# Patient Record
Sex: Male | Born: 1980 | Race: White | Hispanic: No | Marital: Married | Smoking: Current every day smoker
Health system: Southern US, Community
[De-identification: ages and names within clinical notes are randomized; demographics above are authoritative.]

---

## 2002-08-19 ENCOUNTER — Emergency Department (HOSPITAL_COMMUNITY): Admission: EM | Admit: 2002-08-19 | Discharge: 2002-08-19 | Payer: Self-pay | Admitting: Emergency Medicine

## 2003-06-15 ENCOUNTER — Emergency Department (HOSPITAL_COMMUNITY): Admission: EM | Admit: 2003-06-15 | Discharge: 2003-06-15 | Payer: Self-pay | Admitting: Emergency Medicine

## 2010-11-03 ENCOUNTER — Emergency Department: Payer: Self-pay | Admitting: Unknown Physician Specialty

## 2011-01-05 ENCOUNTER — Emergency Department: Payer: Self-pay | Admitting: *Deleted

## 2012-06-05 ENCOUNTER — Emergency Department: Payer: Self-pay | Admitting: Emergency Medicine

## 2013-05-22 ENCOUNTER — Emergency Department: Payer: Self-pay | Admitting: Internal Medicine

## 2014-10-25 ENCOUNTER — Encounter (HOSPITAL_COMMUNITY): Payer: Self-pay | Admitting: Emergency Medicine

## 2014-10-25 ENCOUNTER — Emergency Department (HOSPITAL_COMMUNITY): Payer: Self-pay

## 2014-10-25 ENCOUNTER — Emergency Department (HOSPITAL_COMMUNITY)
Admission: EM | Admit: 2014-10-25 | Discharge: 2014-10-25 | Disposition: A | Discharge status: Home / Self Care | Payer: Self-pay | Attending: Emergency Medicine | Admitting: Emergency Medicine

## 2014-10-25 DIAGNOSIS — X58XXXA Exposure to other specified factors, initial encounter: Secondary | ICD-10-CM | POA: Insufficient documentation

## 2014-10-25 DIAGNOSIS — Y9289 Other specified places as the place of occurrence of the external cause: Secondary | ICD-10-CM | POA: Insufficient documentation

## 2014-10-25 DIAGNOSIS — R109 Unspecified abdominal pain: Secondary | ICD-10-CM

## 2014-10-25 DIAGNOSIS — R1013 Epigastric pain: Secondary | ICD-10-CM | POA: Insufficient documentation

## 2014-10-25 DIAGNOSIS — R079 Chest pain, unspecified: Secondary | ICD-10-CM | POA: Insufficient documentation

## 2014-10-25 DIAGNOSIS — T189XXA Foreign body of alimentary tract, part unspecified, initial encounter: Secondary | ICD-10-CM | POA: Insufficient documentation

## 2014-10-25 DIAGNOSIS — Z720 Tobacco use: Secondary | ICD-10-CM | POA: Insufficient documentation

## 2014-10-25 DIAGNOSIS — Y998 Other external cause status: Secondary | ICD-10-CM | POA: Insufficient documentation

## 2014-10-25 DIAGNOSIS — Y9389 Activity, other specified: Secondary | ICD-10-CM | POA: Insufficient documentation

## 2014-10-25 LAB — BASIC METABOLIC PANEL
Anion gap: 11 (ref 5–15)
BUN: 7 mg/dL (ref 6–20)
CHLORIDE: 102 mmol/L (ref 101–111)
CO2: 23 mmol/L (ref 22–32)
CREATININE: 1.03 mg/dL (ref 0.61–1.24)
Calcium: 9.3 mg/dL (ref 8.9–10.3)
GFR calc Af Amer: >60 mL/min (ref >60)
GFR calc non Af Amer: >60 mL/min (ref >60)
Glucose, Bld: 122 mg/dL — ABNORMAL HIGH (ref 65–99)
POTASSIUM: 3.5 mmol/L (ref 3.5–5.1)
Sodium: 136 mmol/L (ref 135–145)

## 2014-10-25 LAB — CBC
HCT: 45.3 % (ref 39.0–52.0)
Hemoglobin: 15.9 g/dL (ref 13.0–17.0)
MCH: 32.4 pg (ref 26.0–34.0)
MCHC: 35.1 g/dL (ref 30.0–36.0)
MCV: 92.4 fL (ref 78.0–100.0)
PLATELETS: 318 10*3/uL (ref 150–400)
RBC: 4.9 MIL/uL (ref 4.22–5.81)
RDW: 12.4 % (ref 11.5–15.5)
WBC: 15.5 10*3/uL — ABNORMAL HIGH (ref 4.0–10.5)

## 2014-10-25 LAB — I-STAT TROPONIN, ED
TROPONIN I, POC: 0.03 ng/mL (ref 0.00–0.08)
Troponin i, poc: 0 ng/mL (ref 0.00–0.08)

## 2014-10-25 MED ORDER — GI COCKTAIL ~~LOC~~
30.0000 mL | Freq: Once | ORAL | Status: AC
  Administered 2014-10-25: 30 mL via ORAL
  Filled 2014-10-25: qty 30

## 2014-10-25 MED ORDER — SUCRALFATE 1 G PO TABS: 1.0000 g | ORAL_TABLET | Freq: Three times a day (TID) | ORAL | Status: AC

## 2014-10-25 MED ORDER — ESOMEPRAZOLE MAGNESIUM 40 MG PO CPDR: 40.0000 mg | DELAYED_RELEASE_CAPSULE | Freq: Every day | ORAL | Status: DC

## 2014-10-25 MED ORDER — SUCRALFATE 1 G PO TABS
1.0000 g | ORAL_TABLET | Freq: Once | ORAL | Status: AC
  Administered 2014-10-25: 1 g via ORAL
  Filled 2014-10-25: qty 1

## 2014-10-25 NOTE — Discharge Instructions (Signed)
Your caregiver has diagnosed you as having chest pain that is not specific for one problem, but does not require admission.  You are at low risk for an acute heart condition or other serious illness. Chest pain comes from many different causes.  SEEK IMMEDIATE MEDICAL ATTENTION IF: You have severe chest pain, especially if the pain is crushing or pressure-like and spreads to the arms, back, neck, or jaw, or if you have sweating, nausea (feeling sick to your stomach), or shortness of breath. THIS IS AN EMERGENCY. Don't wait to see if the pain will go away. Get medical help at once. Call 911 or 0 (operator). DO NOT drive yourself to the hospital.  Your chest pain gets worse and does not go away with rest.  You have an attack of chest pain lasting longer than usual, despite rest and treatment with the medications your caregiver has prescribed.  You wake from sleep with chest pain or shortness of breath.  You feel dizzy or faint.  You have chest pain not typical of your usual pain for which you originally saw your caregiver. Abdominal (belly) pain can be caused by many things. Your caregiver performed an examination and possibly ordered blood/urine tests and imaging (CT scan, x-rays, ultrasound). Many cases can be observed and treated at home after initial evaluation in the emergency department. Even though you are being discharged home, abdominal pain can be unpredictable. Therefore, you need a repeated exam if your pain does not resolve, returns, or worsens. Most patients with abdominal pain don't have to be admitted to the hospital or have surgery, but serious problems like appendicitis and gallbladder attacks can start out as nonspecific pain. Many abdominal conditions cannot be diagnosed in one visit, so follow-up evaluations are very important. SEEK IMMEDIATE MEDICAL ATTENTION IF: The pain does not go away or becomes severe.  A temperature above 101 develops.  Repeated vomiting occurs (multiple  episodes).  The pain becomes localized to portions of the abdomen. The right side could possibly be appendicitis. In an adult, the left lower portion of the abdomen could be colitis or diverticulitis.  Blood is being passed in stools or vomit (bright red or black tarry stools).  Return also if you develop chest pain, difficulty breathing, dizziness or fainting, or become confused, poorly responsive, or inconsolable (young children).   Swallowed Foreign Body, Adult You have swallowed an object (foreign body). Once the foreign body has passed through the food tube (esophagus), which leads from the mouth to the stomach, it will usually continue through the body without problems. This is because the point where the esophagus enters into the stomach is the narrowest place through which the foreign body must pass. Sometimes the foreign body gets stuck. The most common type of foreign body obstruction in adults is food impaction. Many times, bones from fish or meat products may become lodged in the esophagus or injure the throat on the way down. When there is an object that obstructs the esophagus, the most obvious symptoms are pain and the inability to swallow normally. In some cases, foreign bodies that can be life threatening are swallowed. Examples of these are certain medications and illicit drugs. Often in these instances, patients are afraid of telling what they swallowed. However, it is extremely important to tell the emergency caregiver what was swallowed because life-saving treatment may be needed.  X-ray exams may be taken to find the location of the foreign body. However, some objects do not show up well or may  be too small to be seen on an X-ray image. If the foreign body is too large or too sharp, it may be too dangerous to allow it to pass on its own. You may need to see a caregiver who specializes in the digestive system (gastroenterologist). In a few cases, a specialist may need to remove the  object using a method called "endoscopy". This involves passing a thin, soft, flexible tube into the food pipe to locate and remove the object. Follow up with your primary doctor or the referral you were given by the emergency caregiver. HOME CARE INSTRUCTIONS   If your caregiver says it is safe for you to eat, then only have liquids and soft foods until your symptoms improve.  Once you are eating normally:  Cut food into small pieces.  Remove small bones from food.  Remove large seeds and pits from fruit.  Chew your food well.  Do not talk, laugh, or engage in physical activity while eating or swallowing. SEEK MEDICAL CARE IF:  You develop worsening shortness of breath, uncontrollable coughing, chest pains or high fever, greater than 102 F (38.9 C).  You are unable to eat or drink or you feel that food is getting stuck in your throat.  You have choking symptoms or cannot stop drooling.  You develop abdominal pain, vomiting (especially of blood), or rectal bleeding. MAKE SURE YOU:   Understand these instructions.  Will watch your condition.  Will get help right away if you are not doing well or get worse. Document Released: 10/20/2009 Document Revised: 07/25/2011 Document Reviewed: 10/20/2009 [facility] Patient Information 2015 [place], [place]. This information is not intended to replace advice given to you by your health care provider. Make sure you discuss any questions you have with your health care provider.

## 2014-10-25 NOTE — ED Notes (Signed)
Patient transported to X-ray 

## 2014-10-25 NOTE — ED Notes (Signed)
Pt c/o swallowed stainless steel on Wednesday. C/o left sided chest pain onset today.

## 2014-10-25 NOTE — ED Provider Notes (Addendum)
CSN: [id number]     Arrival date & time 10/25/14  1005 History   First MD Initiated Contact with Patient 10/25/14 1035     Chief Complaint  Patient presents with  . Chest Pain  . Swallowed Foreign Body     (Consider location/radiation/quality/duration/timing/severity/associated sxs/prior Treatment) HPI  This 34 year old male who presents emergency Department with chief complaint of swallowing foreign body and epigastric pain. Patient states that Wednesday, 3 days ago that he was working with a piece of stainless steel metal wire which was abraded. He cut the wire, one piece bounced off his lip, but the other piece went into his mouth and he swallowed it. He states that he didn't notice any side effects until yesterday when he began having epigastric pain and nausea. He states it has worsened since that time. He rates it as moderate, no vomiting, no hematemesis, no diarrhea, melena or hematochezia.  History reviewed. No pertinent past medical history. History reviewed. No pertinent past surgical history. No family history on file. History  Substance Use Topics  . Smoking status: Current Every Day Smoker  . Smokeless tobacco: Not on file  . Alcohol Use: Yes    Review of Systems  Ten systems reviewed and are negative for acute change, except as noted in the HPI.     Allergies  Review of patient's allergies indicates no known allergies.  Home Medications   Prior to Admission medications   Not on File   BP 129/76 mmHg  Pulse 67  Temp(Src) 97.9 F (36.6 C) (Oral)  Resp 16  Ht  (1.803 m)  Wt 200 lb (90.719 kg)  BMI 27.91 kg/m2  SpO2 97% Physical Exam Physical Exam  Nursing note and vitals reviewed. Constitutional: He appears well-developed and well-nourished. No distress.  HENT:  Head: Normocephalic and atraumatic.  Eyes: Conjunctivae normal are normal. No scleral icterus.  Neck: Normal range of motion. Neck supple.  Cardiovascular: Normal rate, regular rhythm  and normal heart sounds.   Pulmonary/Chest: Effort normal and breath sounds normal. No respiratory distress.  Abdominal: Soft. There is no tenderness.  Musculoskeletal: He exhibits no edema.  Neurological: He is alert.  Skin: Skin is warm and dry. He is not diaphoretic.  Psychiatric: His behavior is normal.    ED Course  Procedures (including critical care time) Labs Review Labs Reviewed  CBC - Abnormal; Notable for the following:    WBC 15.5 (*)    All other components within normal limits  BASIC METABOLIC PANEL - Abnormal; Notable for the following:    Glucose, Bld 122 (*)    All other components within normal limits  I-STAT TROPOININ, ED    Imaging Review No results found.   EKG Interpretation   Date/Time:  Saturday October 25 2014 10:11:47 EDT Ventricular Rate:  86 PR Interval:  120 QRS Duration: 90 QT Interval:  366 QTC Calculation: 437 R Axis:   65 Text Interpretation:  Normal sinus rhythm with sinus arrhythmia Normal ECG  No previous tracing Confirmed by Denton Lank  MD, Caryn Bee ([id number]) on 10/25/2014  10:36:42 AM      MDM   Final diagnoses:  Swallowed foreign body, initial encounter    Filed Vitals:   10/25/14 1415 10/25/14 1510 10/25/14 1515 10/25/14 1600  BP: 122/75 119/69 122/79 131/74  Pulse: 58 59 56 67  Temp:    98.1 F (36.7 C)  TempSrc:    Oral  Resp: Height:      Weight:  SpO2: 98% 98% 97% 98%    Patient with epigastric abdominal pain, swallowed foreign body. Pain controlled with Carafate and GI cocktail. He has no evidence of perforation on acute abdominal films. 2 negative troponins and minimal risk factors for acute coronary syndrome. Patient seen in shared visit with Dr. Denton Lank  .    patient is advised to follow-up with community health and wellness  Discharged with Carafate and Nexium. He appears safe for discharge at this time.  Arthor Captain, PA-C 10/25/14 1621  Cathren Laine, MD 10/26/14 0709  Arthor Captain,  PA-C 12/12/14 1232  Cathren Laine, MD 12/22/14 [phone]

## 2015-09-17 ENCOUNTER — Emergency Department
Admission: EM | Admit: 2015-09-17 | Discharge: 2015-09-17 | Disposition: A | Discharge status: Home / Self Care | Payer: Self-pay | Attending: Emergency Medicine | Admitting: Emergency Medicine

## 2015-09-17 ENCOUNTER — Encounter: Payer: Self-pay | Admitting: Emergency Medicine

## 2015-09-17 DIAGNOSIS — F1290 Cannabis use, unspecified, uncomplicated: Secondary | ICD-10-CM | POA: Insufficient documentation

## 2015-09-17 DIAGNOSIS — Z7982 Long term (current) use of aspirin: Secondary | ICD-10-CM | POA: Insufficient documentation

## 2015-09-17 DIAGNOSIS — F17200 Nicotine dependence, unspecified, uncomplicated: Secondary | ICD-10-CM | POA: Insufficient documentation

## 2015-09-17 DIAGNOSIS — Z79899 Other long term (current) drug therapy: Secondary | ICD-10-CM | POA: Insufficient documentation

## 2015-09-17 DIAGNOSIS — L089 Local infection of the skin and subcutaneous tissue, unspecified: Secondary | ICD-10-CM | POA: Insufficient documentation

## 2015-09-17 DIAGNOSIS — B9689 Other specified bacterial agents as the cause of diseases classified elsewhere: Secondary | ICD-10-CM

## 2015-09-17 MED ORDER — HYDROXYZINE HCL 50 MG PO TABS: 50.0000 mg | ORAL_TABLET | Freq: Three times a day (TID) | ORAL | Status: AC | PRN

## 2015-09-17 MED ORDER — MUPIROCIN 2 % EX OINT: TOPICAL_OINTMENT | CUTANEOUS | Status: AC

## 2015-09-17 NOTE — ED Provider Notes (Signed)
[facility] Emergency Department Provider Note   ____________________________________________  Time seen: Approximately 9:46 AM  I have reviewed the triage vital signs and the nursing notes.   HISTORY  Chief Complaint Facial Swelling    HPI Nathan Lara is a 35 y.o. male patient complain pain, edema and rednessinferior left orbital area 3-4 days. Patient denies any drainage from the area. 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 Patient say noticed an abrasion which has increased redness and swelling. Patient does use Epsom salt soaks with some mild relief. He denies any vision change.   History reviewed. No pertinent past medical history.  There are no active problems to display for this patient.   History reviewed. No pertinent past surgical history.  Current Outpatient Rx  Name  Route  Sig  Dispense  Refill  . aspirin-acetaminophen-caffeine (EXCEDRIN MIGRAINE) 250-250-65 MG per tablet   Oral   Take 2 tablets by mouth every 6 (six) hours as needed for headache.         . esomeprazole (NEXIUM) 40 MG capsule   Oral   Take 1 capsule (40 mg total) by mouth daily.   30 capsule   0   . hydrOXYzine (ATARAX/VISTARIL) 50 MG tablet   Oral   Take 1 tablet (50 mg total) by mouth 3 (three) times daily as needed.   30 tablet   0   . mupirocin ointment (BACTROBAN) 2 %      Apply to affected area 3 times daily   22 g   0   . OVER THE COUNTER MEDICATION      1 application daily as needed (Chest/nasal congestion). Mint/lavendar/lemon essential oil combination         . sucralfate (CARAFATE) 1 G tablet   Oral   Take 1 tablet (1 g total) by mouth 4 (four) times daily -  with meals and at bedtime.   60 tablet   0     Allergies Review of patient's allergies indicates no known allergies.  No family history on file.  Social History Social History    Substance Use Topics  . Smoking status: Current Every Day Smoker  . Smokeless tobacco: None  . Alcohol Use: Yes    Review of Systems Constitutional: No fever/chills Eyes: No visual changes. ENT: No sore throat. Cardiovascular: Denies chest pain. Respiratory: Denies shortness of breath. Gastrointestinal: No abdominal pain.  No nausea, no vomiting.  No diarrhea.  No constipation. Genitourinary: Negative for dysuria. Musculoskeletal: Negative for back pain. Skin: Negative for rash.Abrasion left inferior orbital area Neurological: Negative for headaches, focal weakness or numbness.    ____________________________________________   PHYSICAL EXAM:  VITAL SIGNS: ED Triage Vitals  Enc Vitals Group     BP 09/17/15 0931 144/87 mmHg     Pulse Rate 09/17/15 0931 80     Resp 09/17/15 0931 16     Temp 09/17/15 0931 97.5 F (36.4 C)     Temp Source 09/17/15 0931 Oral     SpO2 09/17/15 0931 99 %     Weight 09/17/15 0931 200 lb (90.719 kg)     Height 09/17/15 0931 6' (1.829 m)     Head Cir --      Peak Flow --      Pain Score 09/17/15 0931 1     Pain Loc --      Pain Edu? --  Excl. in GC? --     Constitutional: Alert and oriented. Well appearing and in no acute distress. Eyes: Conjunctivae are normal. PERRL. EOMI. Head: Atraumatic. Nose: No congestion/rhinnorhea. Mouth/Throat: Mucous membranes are moist.  Oropharynx non-erythematous. Neck: No stridor.  No cervical spine tenderness to palpation. Hematological/Lymphatic/Immunilogical: No cervical lymphadenopathy. Cardiovascular: Normal rate, regular rhythm. Grossly normal heart sounds.  Good peripheral circulation. Respiratory: Normal respiratory effort.  No retractions. Lungs CTAB. Gastrointestinal: Soft and nontender. No distention. No abdominal bruits. No CVA tenderness. Musculoskeletal: No lower extremity tenderness nor edema.  No joint effusions. Neurologic:  Normal speech and language. No gross focal neurologic  deficits are appreciated. No gait instability. Skin:  Skin is warm, dry and intact. No rash noted. Psychiatric: Mood and affect are normal. Speech and behavior are normal.  ____________________________________________   LABS (all labs ordered are listed, but only abnormal results are displayed)  Labs Reviewed - No data to display ____________________________________________  EKG   ____________________________________________  RADIOLOGY   ____________________________________________   PROCEDURES  Procedure(s) performed: None  Critical Care performed: No  ____________________________________________   INITIAL IMPRESSION / ASSESSMENT AND PLAN / ED COURSE  Pertinent labs & imaging results that were available during my care of the patient were reviewed by me and considered in my medical decision making (see chart for details).  Infected abrasion inferior left orbital area. Patient given discharge Instructions. Patient given a prescriptions for Bactroban and Atarax. Patient [facility] no improvement in 3-5 days. Return by ER if condition worsens. ____________________________________________   FINAL CLINICAL IMPRESSION(S) / ED DIAGNOSES  Final diagnoses:  Bacterial skin infection      NEW MEDICATIONS STARTED DURING THIS VISIT:  New Prescriptions   HYDROXYZINE (ATARAX/VISTARIL) 50 MG TABLET    Take 1 tablet (50 mg total) by mouth 3 (three) times daily as needed.   MUPIROCIN OINTMENT (BACTROBAN) 2 %    Apply to affected area 3 times daily     Note:  This document was prepared using Dragon voice recognition software and may include unintentional dictation errors.    Joni Reiningonald K Smith, PA-C 09/17/15 [id number]  Jene Everyobert Kinner, MD 09/17/15 1247

## 2015-09-17 NOTE — Discharge Instructions (Signed)
Take medication as directed.

## 2015-09-17 NOTE — ED Notes (Signed)
See triage note  Left eye red and swollen  No drainage note and denies any trauma

## 2015-09-17 NOTE — ED Notes (Signed)
Pt reports abrasion under left eye due to wiping sweat off face; reports hx of the same. Pt reports redness and swelling increased in the past couple of days, used epsom salt, warm water which helped decrease swelling some. Pt reports swelling worse than normal.

## 2015-11-17 IMAGING — CR DG ABDOMEN ACUTE W/ 1V CHEST
4 series · 4 of 4 positions shown · non-contrast
Comparison: 05/22/2013 chest radiograph

CLINICAL DATA: Swallowed a piece of stainless steel wire yesterday
at work. Left upper abdominal pain.

EXAM:
DG ABDOMEN ACUTE W/ 1V CHEST

[chest pa]
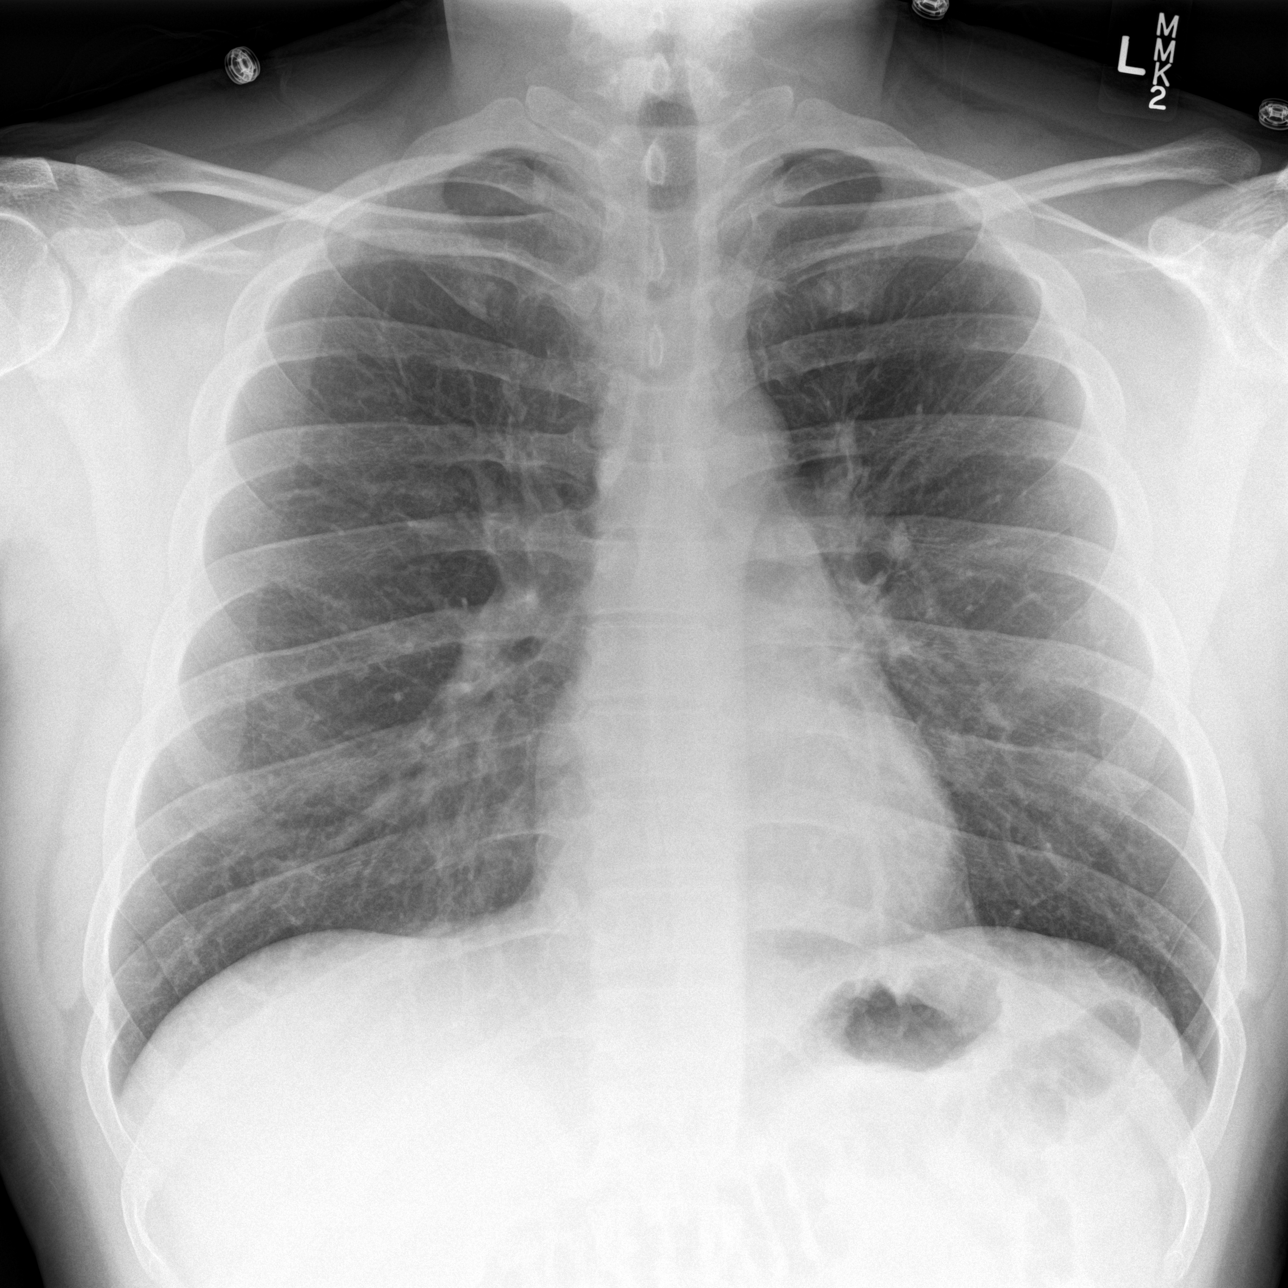

[abdomen erect]
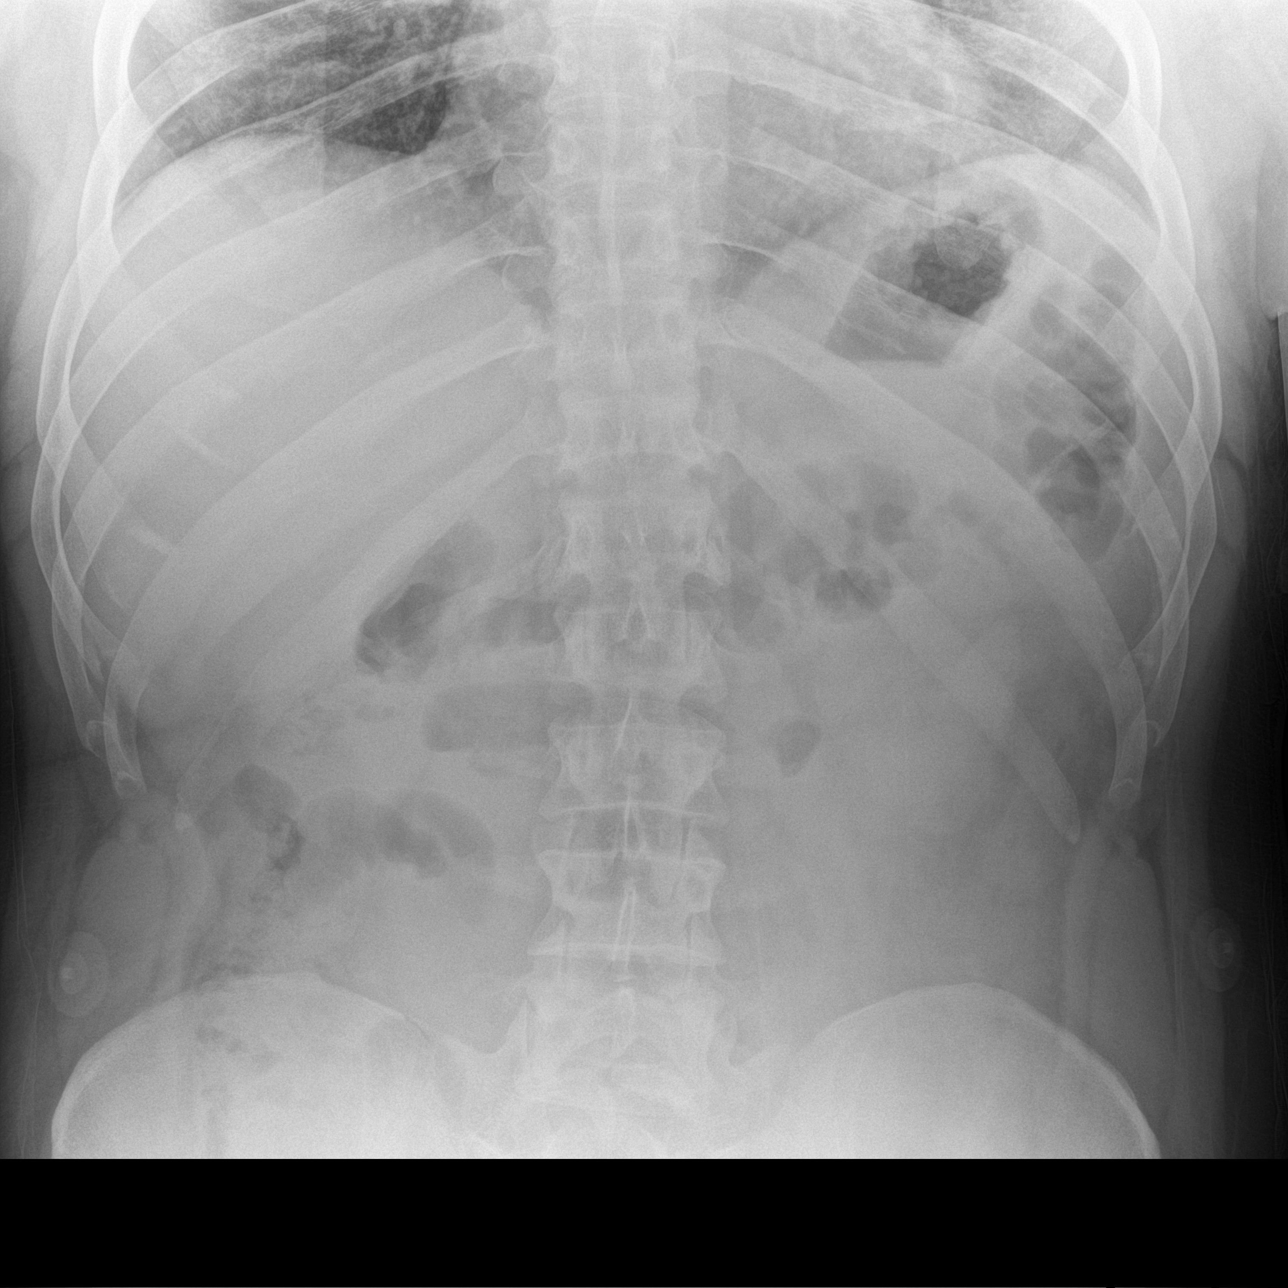

[abdomen supine (1 of 2)]
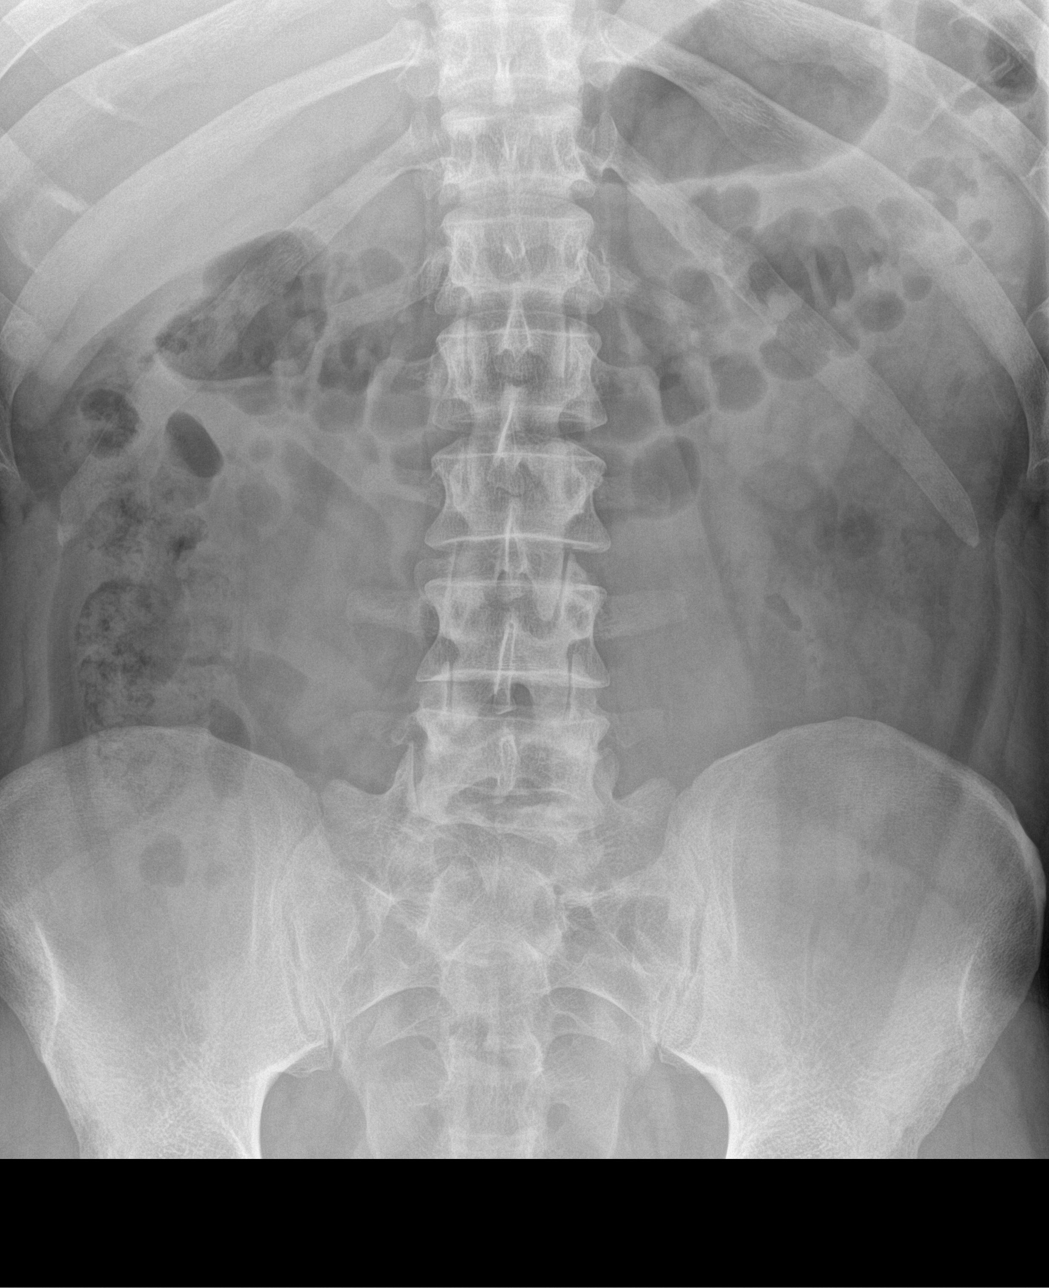

[abdomen supine (2 of 2)]
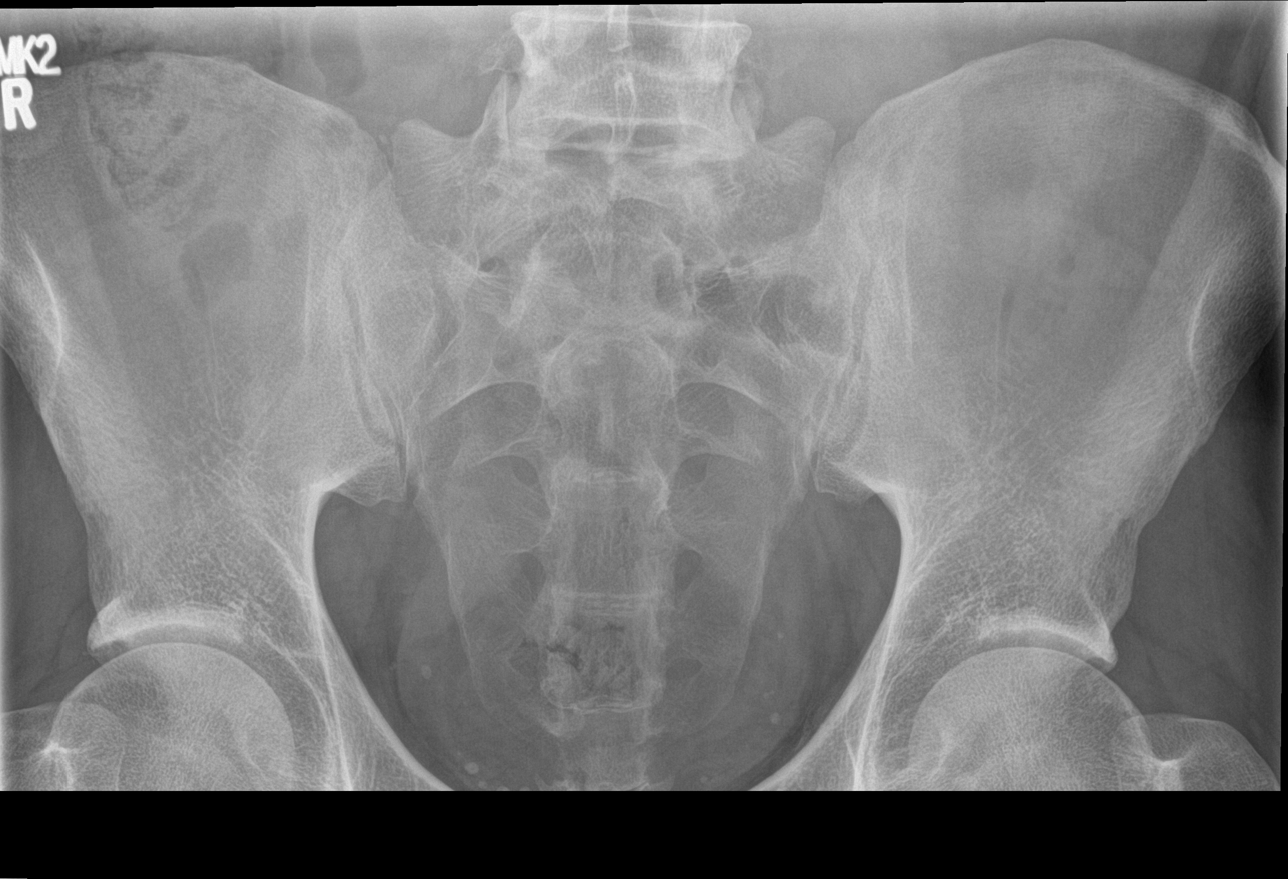

[4 of 4 positions shown; findings below may reference images not displayed]

FINDINGS: Frontal view of the chest demonstrates midline trachea. Normal heart
size and mediastinal contours. No pleural effusion or pneumothorax.
Diffuse peribronchial thickening. No lobar consolidation.

Abdominal films demonstrate no free intraperitoneal air or
significant air-fluid levels on upright positioning. No small bowel
distension. No metallic foreign object identified. Probable
phleboliths in the pelvis. Distal gas and stool.
IMPRESSION: No acute findings.

Peribronchial thickening which may relate to chronic bronchitis or
smoking.

## 2019-08-22 ENCOUNTER — Ambulatory Visit: Payer: Self-pay | Attending: Internal Medicine

## 2019-08-22 DIAGNOSIS — Z23 Encounter for immunization: Secondary | ICD-10-CM

## 2019-08-22 NOTE — Progress Notes (Signed)
   Covid-19 Vaccination Clinic  Name:  Nathan Lara    MRN: [id number] DOB: 12/03/1980  08/22/2019  [name] was observed post Covid-19 immunization for 15 minutes without incident. He was provided with Vaccine Information Sheet and instruction to access the V-Safe system.   [name] was instructed to call 911 with any severe reactions post vaccine: Marland Kitchen Difficulty breathing  . Swelling of face and throat  . A fast heartbeat  . A bad rash all over body  . Dizziness and weakness   Immunizations Administered    Name Date Dose VIS Date Route   Pfizer COVID-19 Vaccine 08/22/2019  9:02 AM 0.3 mL 04/26/2019 Intramuscular   Manufacturer: [employer], [employer]   Lot: [id number]   NDC: [address]-8

## 2019-09-16 ENCOUNTER — Ambulatory Visit: Payer: Self-pay | Attending: Internal Medicine

## 2019-09-16 DIAGNOSIS — Z23 Encounter for immunization: Secondary | ICD-10-CM

## 2019-09-16 NOTE — Progress Notes (Signed)
   Covid-19 Vaccination Clinic  Name:  Nathan Lara    MRN: [id number] DOB: 08/22/1980  09/16/2019  Nathan Lara was observed post Covid-19 immunization for 15 minutes without incident. He was provided with Vaccine Information Sheet and instruction to access the V-Safe system.   Nathan Lara was instructed to call 911 with any severe reactions post vaccine: Marland Kitchen Difficulty breathing  . Swelling of face and throat  . A fast heartbeat  . A bad rash all over body  . Dizziness and weakness   Immunizations Administered    Name Date Dose VIS Date Route   Pfizer COVID-19 Vaccine 09/16/2019  8:31 AM 0.3 mL 07/10/2018 Intramuscular   Manufacturer: [employer], [employer]   Lot: [id number]   NDC: [address]-3

## 2023-04-11 ENCOUNTER — Emergency Department
Admission: EM | Admit: 2023-04-11 | Discharge: 2023-04-11 | Disposition: A | Discharge status: Home / Self Care | Payer: Self-pay | Attending: Emergency Medicine | Admitting: Emergency Medicine

## 2023-04-11 ENCOUNTER — Other Ambulatory Visit: Payer: Self-pay

## 2023-04-11 ENCOUNTER — Emergency Department: Payer: Self-pay

## 2023-04-11 DIAGNOSIS — D72829 Elevated white blood cell count, unspecified: Secondary | ICD-10-CM | POA: Diagnosis not present

## 2023-04-11 DIAGNOSIS — K2900 Acute gastritis without bleeding: Secondary | ICD-10-CM | POA: Insufficient documentation

## 2023-04-11 DIAGNOSIS — R1013 Epigastric pain: Secondary | ICD-10-CM | POA: Diagnosis present

## 2023-04-11 LAB — URINALYSIS, ROUTINE W REFLEX MICROSCOPIC
Bilirubin Urine: NEGATIVE
Glucose, UA: NEGATIVE mg/dL
Hgb urine dipstick: NEGATIVE
Ketones, ur: NEGATIVE mg/dL
Leukocytes,Ua: NEGATIVE
Nitrite: NEGATIVE
Protein, ur: NEGATIVE mg/dL
Specific Gravity, Urine: 1.003 — ABNORMAL LOW (ref 1.005–1.030)
pH: 7 (ref 5.0–8.0)

## 2023-04-11 LAB — CBC
HCT: 45.5 % (ref 39.0–52.0)
Hemoglobin: 16.1 g/dL (ref 13.0–17.0)
MCH: 33.5 pg (ref 26.0–34.0)
MCHC: 35.4 g/dL (ref 30.0–36.0)
MCV: 94.6 fL (ref 80.0–100.0)
Platelets: 326 10*3/uL (ref 150–400)
RBC: 4.81 MIL/uL (ref 4.22–5.81)
RDW: 12.1 % (ref 11.5–15.5)
WBC: 18.8 10*3/uL — ABNORMAL HIGH (ref 4.0–10.5)
nRBC: 0 % (ref 0.0–0.2)

## 2023-04-11 LAB — COMPREHENSIVE METABOLIC PANEL
ALT: 33 U/L (ref 0–44)
AST: 27 U/L (ref 15–41)
Albumin: 4.2 g/dL (ref 3.5–5.0)
Alkaline Phosphatase: 60 U/L (ref 38–126)
Anion gap: 9 (ref 5–15)
BUN: 10 mg/dL (ref 6–20)
CO2: 24 mmol/L (ref 22–32)
Calcium: 9.4 mg/dL (ref 8.9–10.3)
Chloride: 101 mmol/L (ref 98–111)
Creatinine, Ser: 1.02 mg/dL (ref 0.61–1.24)
GFR, Estimated: >60 mL/min (ref >60)
Glucose, Bld: 127 mg/dL — ABNORMAL HIGH (ref 70–99)
Potassium: 4.1 mmol/L (ref 3.5–5.1)
Sodium: 134 mmol/L — ABNORMAL LOW (ref 135–145)
Total Bilirubin: 1.1 mg/dL (ref <1.2)
Total Protein: 7.5 g/dL (ref 6.5–8.1)

## 2023-04-11 LAB — LIPASE, BLOOD: Lipase: 25 U/L (ref 11–51)

## 2023-04-11 MED ORDER — ONDANSETRON 4 MG PO TBDP: 4.0000 mg | ORAL_TABLET | Freq: Three times a day (TID) | ORAL | 0 refills | Status: AC | PRN

## 2023-04-11 MED ORDER — LIDOCAINE VISCOUS HCL 2 % MT SOLN
15.0000 mL | Freq: Once | OROMUCOSAL | Status: AC
  Administered 2023-04-11: 15 mL via ORAL
  Filled 2023-04-11: qty 15

## 2023-04-11 MED ORDER — PANTOPRAZOLE SODIUM 40 MG PO TBEC: 40.0000 mg | DELAYED_RELEASE_TABLET | Freq: Two times a day (BID) | ORAL | 0 refills | Status: AC

## 2023-04-11 MED ORDER — ALUM & MAG HYDROXIDE-SIMETH 200-200-20 MG/5ML PO SUSP
30.0000 mL | Freq: Once | ORAL | Status: AC
  Administered 2023-04-11: 30 mL via ORAL
  Filled 2023-04-11: qty 30

## 2023-04-11 NOTE — ED Provider Notes (Signed)
[facility] Provider Note    Event Date/Time   First MD Initiated Contact with Patient 04/11/23 [phone]     (approximate)   History   Chief Complaint Abdominal Pain   HPI  Nathan Lara is a 42 y.o. male with no significant past medical history who presents to the ED complaining of abdominal pain.  Patient reports that he has had 2 days of constant sharp pain in his epigastrium associated with some nausea.  He has had similar episodes of pain in the past that typically seem to improve when he belches.  He denies any associated pain in his chest and has not had any fever, cough, or difficulty breathing.  He denies any vomiting or diarrhea, has not noticed any blood in his stool.  He also denies any dysuria, hematuria, or flank pain.  He does admit to drinking greater than half a pot of coffee daily and regular use of NSAIDs, denies regular alcohol consumption.     Physical Exam   Triage Vital Signs: ED Triage Vitals  Encounter Vitals Group     BP 04/11/23 0745 (!) 148/91     Systolic BP Percentile --      Diastolic BP Percentile --      Pulse Rate 04/11/23 0745 78     Resp 04/11/23 0745 18     Temp 04/11/23 0745 98 F (36.7 C)     Temp src --      SpO2 04/11/23 0745 99 %     Weight 04/11/23 0739 200 lb (90.7 kg)     Height 04/11/23 0739 6' (1.829 m)     Head Circumference --      Peak Flow --      Pain Score 04/11/23 0739 6     Pain Loc --      Pain Education --      Exclude from Growth Chart --     Most recent vital signs: Vitals:   04/11/23 0745 04/11/23 1045  BP: (!) 148/91 (!) 151/93  Pulse: 78 86  Resp: 18 16  Temp: 98 F (36.7 C) 98.4 F (36.9 C)  SpO2: 99% 100%    Constitutional: Alert and oriented. Eyes: Conjunctivae are normal. Head: Atraumatic. Nose: No congestion/rhinnorhea. Mouth/Throat: Mucous membranes are moist.  Cardiovascular: Normal rate, regular rhythm. Grossly normal heart sounds.  2+ radial pulses  bilaterally. Respiratory: Normal respiratory effort.  No retractions. Lungs CTAB. Gastrointestinal: Soft and tender to palpation in the epigastrium with no rebound or guarding.  No distention. Musculoskeletal: No lower extremity tenderness nor edema.  Neurologic:  Normal speech and language. No gross focal neurologic deficits are appreciated.    ED Results / Procedures / Treatments   Labs (all labs ordered are listed, but only abnormal results are displayed) Labs Reviewed  COMPREHENSIVE METABOLIC PANEL - Abnormal; Notable for the following components:      Result Value   Sodium 134 (*)    Glucose, Bld 127 (*)    All other components within normal limits  CBC - Abnormal; Notable for the following components:   WBC 18.8 (*)    All other components within normal limits  URINALYSIS, ROUTINE W REFLEX MICROSCOPIC - Abnormal; Notable for the following components:   Color, Urine COLORLESS (*)    APPearance CLEAR (*)    Specific Gravity, Urine 1.003 (*)    All other components within normal limits  LIPASE, BLOOD     EKG  ED ECG REPORT I, Leonette Most  Jalen Daluz, the attending physician, personally viewed and interpreted this ECG.   Date: 04/11/2023  EKG Time: 9:22  Rate: 71  Rhythm: normal sinus rhythm  Axis: Normal  Intervals:none  ST&T Change: None  RADIOLOGY Right upper quadrant ultrasound reviewed and interpreted by me with no gallstones or gallbladder wall thickening.  PROCEDURES:  Critical Care performed: No  Procedures   MEDICATIONS ORDERED IN ED: Medications  alum & mag hydroxide-simeth (MAALOX/MYLANTA) 200-200-20 MG/5ML suspension 30 mL (30 mLs Oral Given 04/11/23 0849)    And  lidocaine (XYLOCAINE) 2 % viscous mouth solution 15 mL (15 mLs Oral Given 04/11/23 0849)     IMPRESSION / MDM / ASSESSMENT AND PLAN / ED COURSE  I reviewed the triage vital signs and the nursing notes.                              42 y.o. male with no significant past medical history  presents to the ED with intermittent episodes of epigastric abdominal pain recently, now with constant pain over the past 2 days.  Patient's presentation is most consistent with acute presentation with potential threat to life or bodily function.  Differential diagnosis includes, but is not limited to, gastritis, PUD, pancreatitis, hepatitis, cholecystitis, biliary colic.  Patient well-appearing and in no acute distress, vital signs are unremarkable.  Labs show significant leukocytosis with no associated anemia, electrolyte abnormality, or AKI.  LFTs and lipase are unremarkable, we will further assess with right upper quadrant ultrasound.  Plan to treat symptomatically with GI cocktail and IV Zofran.  Right upper quadrant ultrasound is unremarkable, patient reports feeling much better following GI cocktail.  Suspect gastritis versus PUD and patient appropriate for outpatient management with GI follow-up.  We will start him on a PPI and he was counseled to avoid NSAIDs as well as alcohol and other acidic beverages.  He was counseled to return to the ED for new or worsening symptoms, patient agrees with plan.      FINAL CLINICAL IMPRESSION(S) / ED DIAGNOSES   Final diagnoses:  Epigastric pain  Acute gastritis without hemorrhage, unspecified gastritis type     Rx / DC Orders   ED Discharge Orders          Ordered    pantoprazole (PROTONIX) 40 MG tablet  2 times daily before meals        04/11/23 1047    ondansetron (ZOFRAN-ODT) 4 MG disintegrating tablet  Every 8 hours PRN        04/11/23 1047             Note:  This document was prepared using Dragon voice recognition software and may include unintentional dictation errors.   Chesley Noon, MD 04/11/23 1048

## 2023-04-11 NOTE — ED Triage Notes (Signed)
Pt comes with c/o belly pain that started yesterday. Pt states he burped and thought it would help but it didn't. Pt states this has happened in past and usually the burp helps.   Pt states some nausea and no vomiting. Pt states increased discomfort.   Pt denies any urinary symptoms.
